# Patient Record
Sex: Male | Born: 2000 | Race: Black or African American | Hispanic: No | Marital: Single | Smoking: Never smoker
Health system: Southern US, Community
[De-identification: ages and names within clinical notes are randomized; demographics above are authoritative.]

## PROBLEM LIST (undated history)

## (undated) ENCOUNTER — Emergency Department (HOSPITAL_BASED_OUTPATIENT_CLINIC_OR_DEPARTMENT_OTHER): Discharge status: Absent without leave | Payer: No Typology Code available for payment source

## (undated) DIAGNOSIS — Z889 Allergy status to unspecified drugs, medicaments and biological substances status: Secondary | ICD-10-CM

## (undated) DIAGNOSIS — J45909 Unspecified asthma, uncomplicated: Secondary | ICD-10-CM

---
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
## 2021-01-02 ENCOUNTER — Ambulatory Visit (INDEPENDENT_AMBULATORY_CARE_PROVIDER_SITE_OTHER): Payer: Managed Care, Other (non HMO)

## 2021-01-02 DIAGNOSIS — J309 Allergic rhinitis, unspecified: Secondary | ICD-10-CM | POA: Diagnosis not present

## 2021-01-30 ENCOUNTER — Ambulatory Visit (INDEPENDENT_AMBULATORY_CARE_PROVIDER_SITE_OTHER): Payer: Managed Care, Other (non HMO)

## 2021-01-30 DIAGNOSIS — J309 Allergic rhinitis, unspecified: Secondary | ICD-10-CM | POA: Diagnosis not present

## 2021-04-23 ENCOUNTER — Ambulatory Visit (INDEPENDENT_AMBULATORY_CARE_PROVIDER_SITE_OTHER): Payer: Managed Care, Other (non HMO)

## 2021-04-23 DIAGNOSIS — J309 Allergic rhinitis, unspecified: Secondary | ICD-10-CM

## 2021-04-28 ENCOUNTER — Ambulatory Visit (INDEPENDENT_AMBULATORY_CARE_PROVIDER_SITE_OTHER): Payer: Managed Care, Other (non HMO)

## 2021-04-28 DIAGNOSIS — J309 Allergic rhinitis, unspecified: Secondary | ICD-10-CM

## 2021-06-02 ENCOUNTER — Ambulatory Visit (INDEPENDENT_AMBULATORY_CARE_PROVIDER_SITE_OTHER): Payer: Managed Care, Other (non HMO)

## 2021-06-02 DIAGNOSIS — J309 Allergic rhinitis, unspecified: Secondary | ICD-10-CM | POA: Diagnosis not present
